# Patient Record
Sex: Female | Born: 1955 | Race: White | Hispanic: No | Marital: Married | Smoking: Never smoker
Health system: Southern US, Community
[De-identification: ages and names within clinical notes are randomized; demographics above are authoritative.]

## PROBLEM LIST (undated history)

## (undated) DIAGNOSIS — E8881 Metabolic syndrome: Secondary | ICD-10-CM

## (undated) DIAGNOSIS — H40053 Ocular hypertension, bilateral: Secondary | ICD-10-CM

## (undated) DIAGNOSIS — K449 Diaphragmatic hernia without obstruction or gangrene: Secondary | ICD-10-CM

## (undated) DIAGNOSIS — K219 Gastro-esophageal reflux disease without esophagitis: Secondary | ICD-10-CM

## (undated) HISTORY — PX: BREAST BIOPSY: SHX20

## (undated) HISTORY — DX: Ocular hypertension, bilateral: H40.053

## (undated) HISTORY — DX: Metabolic syndrome: E88.81

## (undated) HISTORY — DX: Diaphragmatic hernia without obstruction or gangrene: K44.9

## (undated) HISTORY — DX: Gastro-esophageal reflux disease without esophagitis: K21.9

---

## 2005-07-23 HISTORY — PX: CHOLECYSTECTOMY: SHX55

## 2007-06-11 ENCOUNTER — Encounter: Admission: RE | Admit: 2007-06-11 | Discharge: 2007-07-14 | Payer: Self-pay | Admitting: Family Medicine

## 2007-06-16 ENCOUNTER — Encounter: Admission: RE | Admit: 2007-06-16 | Discharge: 2007-07-14 | Payer: Self-pay | Admitting: Family Medicine

## 2007-09-02 ENCOUNTER — Other Ambulatory Visit: Admission: RE | Admit: 2007-09-02 | Discharge: 2007-09-02 | Payer: Self-pay | Admitting: Obstetrics and Gynecology

## 2009-02-14 ENCOUNTER — Inpatient Hospital Stay (HOSPITAL_COMMUNITY): Admission: EM | Admit: 2009-02-14 | Discharge: 2009-02-15 | Payer: Self-pay | Admitting: Emergency Medicine

## 2009-02-18 ENCOUNTER — Encounter: Admission: RE | Admit: 2009-02-18 | Discharge: 2009-02-18 | Payer: Self-pay | Admitting: Obstetrics and Gynecology

## 2009-03-30 ENCOUNTER — Encounter: Admission: RE | Admit: 2009-03-30 | Discharge: 2009-03-30 | Payer: Self-pay | Admitting: Cardiology

## 2009-10-28 ENCOUNTER — Encounter: Admission: RE | Admit: 2009-10-28 | Discharge: 2009-10-28 | Payer: Self-pay | Admitting: Family Medicine

## 2009-11-08 ENCOUNTER — Other Ambulatory Visit: Admission: RE | Admit: 2009-11-08 | Discharge: 2009-11-08 | Payer: Self-pay | Admitting: Obstetrics and Gynecology

## 2010-02-22 ENCOUNTER — Encounter: Admission: RE | Admit: 2010-02-22 | Discharge: 2010-02-22 | Payer: Self-pay | Admitting: Family Medicine

## 2010-10-29 LAB — CARDIAC PANEL(CRET KIN+CKTOT+MB+TROPI)
CK, MB: 0.9 ng/mL (ref 0.3–4.0)
CK, MB: 1 ng/mL (ref 0.3–4.0)
Relative Index: INVALID (ref 0.0–2.5)
Total CK: 42 U/L (ref 7–177)
Total CK: 49 U/L (ref 7–177)
Troponin I: 0.01 ng/mL (ref 0.00–0.06)

## 2010-10-29 LAB — POCT I-STAT, CHEM 8
BUN: 14 mg/dL (ref 6–23)
Calcium, Ion: 1.23 mmol/L (ref 1.12–1.32)
Chloride: 105 meq/L (ref 96–112)
Creatinine, Ser: 0.8 mg/dL (ref 0.4–1.2)
Glucose, Bld: 92 mg/dL (ref 70–99)
HCT: 40 % (ref 36.0–46.0)
Hemoglobin: 13.6 g/dL (ref 12.0–15.0)
Potassium: 3.8 meq/L (ref 3.5–5.1)
Sodium: 141 meq/L (ref 135–145)
TCO2: 26 mmol/L (ref 0–100)

## 2010-10-29 LAB — COMPREHENSIVE METABOLIC PANEL WITH GFR
ALT: 18 U/L (ref 0–35)
AST: 16 U/L (ref 0–37)
Albumin: 3.5 g/dL (ref 3.5–5.2)
Alkaline Phosphatase: 55 U/L (ref 39–117)
BUN: 11 mg/dL (ref 6–23)
CO2: 28 meq/L (ref 19–32)
Calcium: 9.2 mg/dL (ref 8.4–10.5)
Chloride: 107 meq/L (ref 96–112)
Creatinine, Ser: 0.67 mg/dL (ref 0.4–1.2)
GFR calc non Af Amer: >60 mL/min
Glucose, Bld: 98 mg/dL (ref 70–99)
Potassium: 3.7 meq/L (ref 3.5–5.1)
Sodium: 141 meq/L (ref 135–145)
Total Bilirubin: 0.8 mg/dL (ref 0.3–1.2)
Total Protein: 6.8 g/dL (ref 6.0–8.3)

## 2010-10-29 LAB — HEPARIN LEVEL (UNFRACTIONATED): Heparin Unfractionated: 0.85 IU/mL — ABNORMAL HIGH (ref 0.30–0.70)

## 2010-10-29 LAB — POCT CARDIAC MARKERS
CKMB, poc: <1 ng/mL — ABNORMAL LOW (ref 1.0–8.0)
Myoglobin, poc: 47.8 ng/mL (ref 12–200)
Troponin i, poc: <0.05 ng/mL (ref 0.00–0.09)

## 2010-10-29 LAB — DIFFERENTIAL
Lymphs Abs: 1.2 10*3/uL (ref 0.7–4.0)
Neutro Abs: 4.8 10*3/uL (ref 1.7–7.7)

## 2010-10-29 LAB — CK TOTAL AND CKMB (NOT AT ARMC)
CK, MB: 1 ng/mL (ref 0.3–4.0)
Relative Index: INVALID (ref 0.0–2.5)
Total CK: 48 U/L (ref 7–177)

## 2010-10-29 LAB — TSH: TSH: 1.493 u[IU]/mL (ref 0.350–4.500)

## 2010-10-29 LAB — CBC
MCHC: 34.4 g/dL (ref 30.0–36.0)
MCV: 89.5 fL (ref 78.0–100.0)
RBC: 4.33 MIL/uL (ref 3.87–5.11)
RDW: 12.9 % (ref 11.5–15.5)
WBC: 6.5 10*3/uL (ref 4.0–10.5)

## 2010-10-29 LAB — LIPID PANEL
Cholesterol: 175 mg/dL (ref 0–200)
LDL Cholesterol: 94 mg/dL (ref 0–99)

## 2010-10-29 LAB — PROTIME-INR
INR: 1 (ref 0.00–1.49)
Prothrombin Time: 12.8 s (ref 11.6–15.2)

## 2010-10-29 LAB — TROPONIN I: Troponin I: 0.01 ng/mL (ref 0.00–0.06)

## 2010-10-29 LAB — APTT: aPTT: 28 s (ref 24–37)

## 2010-12-05 NOTE — H&P (Signed)
Robyn Madden, Robyn Madden            ACCOUNT NO.:  [id number]   MEDICAL RECORD NO.:  [id number]          PATIENT TYPE:  INP   LOCATION:  1828                         FACILITY:  [facility]   PHYSICIAN:  Georga Hacking, M.D.DATE OF BIRTH:  [birth year]   DATE OF ADMISSION:  02/14/2009  DATE OF DISCHARGE:                              HISTORY & PHYSICAL   REASON FOR ADMISSION:  Chest pain.   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  She has a history of obesity and has  mild dyslipidemia with a previous reported HDL of 40 and an LDL of 120  or 130.  She has been in good health and has only a remote history of a  cholecystectomy.  At the time of her cholecystectomy, she  had some  vague left chest and jaw pain that resolved and required intravenous  nitroglycerin overnight, and a stress Cardiolite study in [place] several  years ago that was negative.  She has noticed some intermittent left  chest discomfort when she attempted to exercise on an exercise machine  but attributed it to musculoskeletal pain.  She was seeing patients in  the office this afternoon and had a patient that was quite ill and she  was under stress and in the process of getting the patient evaluated by  EMS, she had the onset of left-sided chest pain described as a deep  pressure-like pain and then had some aching in her left jaw area.  She  became concerned enough that she sought attention of a colleague in her  office.  An EKG at that time showed questionable vague ST depression  laterally.  She was given aspirin and nitroglycerin and transported here  by EMS.  While in transport here, she describes more that the chest pain  moved to the center of her chest but described that more as being  cramped on the stretcher and thought it might be more muscular.  Since  then, she thinks the pain may have eased off, although she has had some  very vague waxing and waning  jaw pains but has not really had any acute  distress.  She did not have severe diaphoresis or shortness of breath  with the pain but did note to being under stress at the time of the  pain.  She is largely pain-free right now.   PAST MEDICAL HISTORY:  Remarkable for:  1. Obesity.  2. Mild hyperlipidemia as noted above.  3. Possible reflux in the past.  4. There is no history of hypertension or diabetes.   PAST SURGERY:  Cholecystectomy.   ALLERGIES:  She reports ENVIRONMENTAL ALLERGIES TO PERFUME treated with  albuterol inhaler but has no medication allergies.   CURRENT MEDICATIONS:  None except for a p.r.n. albuterol inhaler.   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   FAMILY HISTORY:  Negative for premature cardiac disease.  Father is  alive at  age 66 with hypertension and a history of thyroid trouble.  Mother is alive, age 82, with a history of breast cancer.  One sister is  alive and well.  One brother has a history of testicular cancer.  Another brother is alive and well.   REVIEW OF SYSTEMS:  Her weight has been stable for several years.  She  has been overweight and obese.  No skin problems.  No eye, ear, nose, or  throat problems.  No asthma, cough, wheezing, or hemoptysis.  She  complains of occasional esophageal reflux and takes intermittent  Protonix and Tums.  She has no urinary symptoms or GYN symptoms.  She  has occasional plantar fasciitis and bursitis of her hip.  No history of  stroke or TIA or significant headaches.   PHYSICAL EXAMINATION:  GENERAL:  She is a very pleasant obese female  currently in no acute distress.  VITAL SIGNS:  Blood pressure is 113/72, pulse 90 and regular.  SKIN:  Warm and dry.  ENT:  Tinnie Gens, CNS clear.  Fundi not examined.  Pharynx is not  examined.  NECK:  Supple without masses, JVD, thyromegaly, or  bruits.  LUNGS:  Clear.  CARDIOVASCULAR:  Normal S1 and S2, no S3, and no murmur.  ABDOMEN:  Soft and nontender.  No masses are noted.  EXTREMITIES:  Distal pulses are 2 to 3+ and symmetrical.  There is no  edema noted.   A 12-lead EKG is normal.   IMPRESSION:  1. Atypical left chest pain with some jaw pain, currently normal      electrocardiogram (EKG), rule out myocardial infarction.  2. History of possible mild dyslipidemia.  3. Obesity.   RECOMMENDATIONS:  Begin IV heparin, nitro, low-dose beta blockers,  aspirin, and check serial enzymes.  Consider cardiac CT to evaluate  coronary anatomy if rules out for MI.      Georga Hacking, M.D.  Electronically Signed     Georga Hacking, M.D.  Electronically Signed    WST/MEDQ  D:  02/14/2009  T:  02/14/2009  Job:  [id number]

## 2010-12-08 NOTE — Discharge Summary (Signed)
Robyn Madden, BHATT            ACCOUNT NO.:  [id number]   MEDICAL RECORD NO.:  [id number]          PATIENT TYPE:  INP   LOCATION:  2925                         FACILITY:  [facility]   PHYSICIAN:  Georga Hacking, M.D.DATE OF BIRTH:  [birth year]   DATE OF ADMISSION:  02/14/2009  DATE OF DISCHARGE:  02/15/2009                               DISCHARGE SUMMARY   FINAL DIAGNOSES:  1. Chest pain of undetermined etiology- myocardial infarction ruled      out.  A cardiac computed tomography scan showing a calcium score of      0 and no significant proximal obstructive coronary artery disease.  2. Mild hyperlipidemia.  3. Obesity.  4. Possible history of esophageal reflux.   PROCEDURE:  Cardiac CT.   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  She has a remote history of cholecystectomy.  She had vague  left chest and jaw pain that resolved and required intravenous  nitroglycerin overnight.  A stress Cardiolite study in [place] several  years ago was negative.  She has noted intermittent left chest pain when  attempting to exercise on an exercise machine which attributed to  musculoskeletal pain.  She had the onset of left-sided chest pain  described as a deep pressure-like pain with aching in her left jaw area,  and sought the attention of a colleague in her office.  EKG at that time  showed vague ST depression laterally possibly.  She was given aspirin  and nitroglycerin, and transported here by EMS.  Pain on transport moved  more  to the center of her chest and the pain had eased off.  She was  admitted for further evaluation.  Please see the previously dictated  history and physical for remainder of the details.   HOSPITAL COURSE:  Lab data showed normal CBC.  Serial cardiac enzymes  were negative.  Cholesterol is 175, triglycerides 132, HDL 55, LDL of  94, total cholesterol ratio of 3.2.  TSH is 1.493.  Chest x-ray was  unremarkable.  A cardiac CT scan  showed a calcium score of 0.  There was  no evidence of proximal coronary disease present.  The ejection fraction  was 60%.  There was mild aortic root dilation at 38 mm.  There also was  felt to be a possible low-density structure involving the lateral left  hepatic lobe measuring 4.1 x 3.1 cm and a moderate size hiatal hernia.  It was felt it represented a cyst, but could be better evaluated by  abdominal ultrasound.  She was feeling fine.  Had no  recurrence of chest pain, and was discharged at home on Prilosec 40 mg  daily and to follow up with me in 2 weeks.  It is recommended that she  have an echocardiogram in 1 year.  She should also have a follow-up  abdominal ultrasound.      Georga Hacking, M.D.  Electronically Signed     Georga Hacking, M.D.  Electronically Signed    WST/MEDQ  D:  02/28/2009  T:  02/28/2009  Job:  [id number]   cc:  [facility]

## 2011-02-15 ENCOUNTER — Other Ambulatory Visit: Payer: Self-pay | Admitting: Family Medicine

## 2011-02-15 DIAGNOSIS — Z1231 Encounter for screening mammogram for malignant neoplasm of breast: Secondary | ICD-10-CM

## 2011-03-12 ENCOUNTER — Ambulatory Visit
Admission: RE | Admit: 2011-03-12 | Discharge: 2011-03-12 | Disposition: A | Discharge status: Home / Self Care | Payer: PRIVATE HEALTH INSURANCE | Source: Ambulatory Visit | Attending: Family Medicine | Admitting: Family Medicine

## 2011-03-12 DIAGNOSIS — Z1231 Encounter for screening mammogram for malignant neoplasm of breast: Secondary | ICD-10-CM

## 2011-04-10 ENCOUNTER — Emergency Department (HOSPITAL_COMMUNITY)
Admission: EM | Admit: 2011-04-10 | Discharge: 2011-04-10 | Disposition: A | Discharge status: Home / Self Care | Payer: PRIVATE HEALTH INSURANCE | Attending: Emergency Medicine | Admitting: Emergency Medicine

## 2011-04-10 ENCOUNTER — Emergency Department (HOSPITAL_COMMUNITY): Payer: PRIVATE HEALTH INSURANCE

## 2011-04-10 DIAGNOSIS — J069 Acute upper respiratory infection, unspecified: Secondary | ICD-10-CM | POA: Insufficient documentation

## 2011-04-10 DIAGNOSIS — R0602 Shortness of breath: Secondary | ICD-10-CM | POA: Insufficient documentation

## 2011-04-10 DIAGNOSIS — J40 Bronchitis, not specified as acute or chronic: Secondary | ICD-10-CM | POA: Insufficient documentation

## 2011-04-10 LAB — CBC
HCT: 40.8 % (ref 36.0–46.0)
Hemoglobin: 13.4 g/dL (ref 12.0–15.0)
MCHC: 32.8 g/dL (ref 30.0–36.0)
Platelets: 228 10*3/uL (ref 150–400)
RDW: 12.8 % (ref 11.5–15.5)
WBC: 5.7 10*3/uL (ref 4.0–10.5)

## 2011-04-10 LAB — BASIC METABOLIC PANEL
BUN: 12 mg/dL (ref 6–23)
Chloride: 104 mEq/L (ref 96–112)
GFR calc Af Amer: >60 mL/min (ref >60)
Potassium: 3.7 mEq/L (ref 3.5–5.1)
Sodium: 140 mEq/L (ref 135–145)

## 2011-04-10 LAB — DIFFERENTIAL
Lymphs Abs: 1 10*3/uL (ref 0.7–4.0)
Monocytes Absolute: 0.4 10*3/uL (ref 0.1–1.0)
Monocytes Relative: 6 % (ref 3–12)
Neutro Abs: 4.2 10*3/uL (ref 1.7–7.7)

## 2011-04-10 LAB — URINALYSIS, ROUTINE W REFLEX MICROSCOPIC
Bilirubin Urine: NEGATIVE
Glucose, UA: NEGATIVE mg/dL
Hgb urine dipstick: NEGATIVE
Protein, ur: NEGATIVE mg/dL
Specific Gravity, Urine: 1.004 — ABNORMAL LOW (ref 1.005–1.030)
Urobilinogen, UA: 0.2 mg/dL (ref 0.0–1.0)

## 2011-04-10 LAB — URINE MICROSCOPIC-ADD ON

## 2012-02-28 ENCOUNTER — Other Ambulatory Visit: Payer: Self-pay | Admitting: Family Medicine

## 2012-02-28 DIAGNOSIS — Z1231 Encounter for screening mammogram for malignant neoplasm of breast: Secondary | ICD-10-CM

## 2012-03-14 ENCOUNTER — Ambulatory Visit
Admission: RE | Admit: 2012-03-14 | Discharge: 2012-03-14 | Disposition: A | Discharge status: Home / Self Care | Payer: BC Managed Care – PPO | Source: Ambulatory Visit | Attending: Family Medicine | Admitting: Family Medicine

## 2012-03-14 DIAGNOSIS — Z1231 Encounter for screening mammogram for malignant neoplasm of breast: Secondary | ICD-10-CM

## 2013-03-10 ENCOUNTER — Other Ambulatory Visit: Payer: Self-pay

## 2013-03-10 DIAGNOSIS — Z1231 Encounter for screening mammogram for malignant neoplasm of breast: Secondary | ICD-10-CM

## 2013-03-18 ENCOUNTER — Ambulatory Visit: Payer: BC Managed Care – PPO

## 2013-04-13 ENCOUNTER — Ambulatory Visit
Admission: RE | Admit: 2013-04-13 | Discharge: 2013-04-13 | Disposition: A | Discharge status: Home / Self Care | Payer: BC Managed Care – PPO | Source: Ambulatory Visit

## 2013-04-13 DIAGNOSIS — Z1231 Encounter for screening mammogram for malignant neoplasm of breast: Secondary | ICD-10-CM

## 2013-09-21 ENCOUNTER — Encounter: Payer: Self-pay | Admitting: General Surgery

## 2014-04-16 ENCOUNTER — Other Ambulatory Visit: Payer: Self-pay

## 2014-04-16 DIAGNOSIS — Z1231 Encounter for screening mammogram for malignant neoplasm of breast: Secondary | ICD-10-CM

## 2014-04-29 ENCOUNTER — Ambulatory Visit: Payer: BC Managed Care – PPO

## 2014-05-07 ENCOUNTER — Ambulatory Visit
Admission: RE | Admit: 2014-05-07 | Discharge: 2014-05-07 | Disposition: A | Discharge status: Home / Self Care | Payer: BC Managed Care – PPO | Source: Ambulatory Visit

## 2014-05-07 DIAGNOSIS — Z1231 Encounter for screening mammogram for malignant neoplasm of breast: Secondary | ICD-10-CM

## 2015-04-20 ENCOUNTER — Other Ambulatory Visit: Payer: Self-pay

## 2015-04-20 DIAGNOSIS — Z1231 Encounter for screening mammogram for malignant neoplasm of breast: Secondary | ICD-10-CM

## 2015-05-11 ENCOUNTER — Ambulatory Visit: Payer: Self-pay

## 2015-06-03 ENCOUNTER — Ambulatory Visit
Admission: RE | Admit: 2015-06-03 | Discharge: 2015-06-03 | Disposition: A | Discharge status: Home / Self Care | Payer: BLUE CROSS/BLUE SHIELD | Source: Ambulatory Visit

## 2015-06-03 DIAGNOSIS — Z1231 Encounter for screening mammogram for malignant neoplasm of breast: Secondary | ICD-10-CM

## 2016-01-20 ENCOUNTER — Ambulatory Visit: Payer: Self-pay | Admitting: Podiatry

## 2016-06-06 ENCOUNTER — Other Ambulatory Visit: Payer: Self-pay | Admitting: Obstetrics and Gynecology

## 2016-06-06 DIAGNOSIS — Z1231 Encounter for screening mammogram for malignant neoplasm of breast: Secondary | ICD-10-CM

## 2016-07-18 ENCOUNTER — Ambulatory Visit
Admission: RE | Admit: 2016-07-18 | Discharge: 2016-07-18 | Disposition: A | Discharge status: Home / Self Care | Payer: No Typology Code available for payment source | Source: Ambulatory Visit | Attending: Obstetrics and Gynecology | Admitting: Obstetrics and Gynecology

## 2016-07-18 DIAGNOSIS — Z1231 Encounter for screening mammogram for malignant neoplasm of breast: Secondary | ICD-10-CM

## 2017-06-20 ENCOUNTER — Other Ambulatory Visit: Payer: Self-pay | Admitting: Obstetrics and Gynecology

## 2017-06-20 DIAGNOSIS — Z1231 Encounter for screening mammogram for malignant neoplasm of breast: Secondary | ICD-10-CM

## 2017-07-31 ENCOUNTER — Ambulatory Visit
Admission: RE | Admit: 2017-07-31 | Discharge: 2017-07-31 | Disposition: A | Discharge status: Home / Self Care | Payer: No Typology Code available for payment source | Source: Ambulatory Visit | Attending: Obstetrics and Gynecology | Admitting: Obstetrics and Gynecology

## 2017-07-31 DIAGNOSIS — Z1231 Encounter for screening mammogram for malignant neoplasm of breast: Secondary | ICD-10-CM

## 2017-09-11 ENCOUNTER — Encounter: Payer: Self-pay | Admitting: Family Medicine

## 2017-09-11 ENCOUNTER — Ambulatory Visit (INDEPENDENT_AMBULATORY_CARE_PROVIDER_SITE_OTHER): Payer: No Typology Code available for payment source | Admitting: Family Medicine

## 2017-09-11 VITALS — BP 140/74 | HR 105 | Temp 98.6°F | Wt 245.4 lb

## 2017-09-11 DIAGNOSIS — H40053 Ocular hypertension, bilateral: Secondary | ICD-10-CM | POA: Diagnosis not present

## 2017-09-11 DIAGNOSIS — Z1159 Encounter for screening for other viral diseases: Secondary | ICD-10-CM

## 2017-09-11 DIAGNOSIS — Z114 Encounter for screening for human immunodeficiency virus [HIV]: Secondary | ICD-10-CM

## 2017-09-11 DIAGNOSIS — Z0000 Encounter for general adult medical examination without abnormal findings: Secondary | ICD-10-CM

## 2017-09-11 DIAGNOSIS — E669 Obesity, unspecified: Secondary | ICD-10-CM | POA: Diagnosis not present

## 2017-09-11 DIAGNOSIS — E88819 Insulin resistance, unspecified: Secondary | ICD-10-CM

## 2017-09-11 DIAGNOSIS — E8881 Metabolic syndrome: Secondary | ICD-10-CM

## 2017-09-11 DIAGNOSIS — K219 Gastro-esophageal reflux disease without esophagitis: Secondary | ICD-10-CM

## 2017-09-11 DIAGNOSIS — K449 Diaphragmatic hernia without obstruction or gangrene: Secondary | ICD-10-CM | POA: Diagnosis not present

## 2017-09-11 HISTORY — DX: Gastro-esophageal reflux disease without esophagitis: K21.9

## 2017-09-11 HISTORY — DX: Metabolic syndrome: E88.81

## 2017-09-11 HISTORY — DX: Insulin resistance, unspecified: E88.819

## 2017-09-11 HISTORY — DX: Ocular hypertension, bilateral: H40.053

## 2017-09-11 LAB — COMPREHENSIVE METABOLIC PANEL
ALT: 17 U/L (ref 0–35)
AST: 14 U/L (ref 0–37)
Albumin: 3.8 g/dL (ref 3.5–5.2)
Alkaline Phosphatase: 59 U/L (ref 39–117)
BUN: 11 mg/dL (ref 6–23)
CO2: 26 mEq/L (ref 19–32)
Calcium: 9.2 mg/dL (ref 8.4–10.5)
Chloride: 107 mEq/L (ref 96–112)
Creatinine, Ser: 0.65 mg/dL (ref 0.40–1.20)
GFR: 98.44 mL/min (ref >60.00)
Glucose, Bld: 107 mg/dL — ABNORMAL HIGH (ref 70–99)
Potassium: 3.8 mEq/L (ref 3.5–5.1)
Sodium: 140 mEq/L (ref 135–145)
Total Bilirubin: 0.9 mg/dL (ref 0.2–1.2)
Total Protein: 6.8 g/dL (ref 6.0–8.3)

## 2017-09-11 LAB — CBC WITH DIFFERENTIAL/PLATELET
Basophils Absolute: 0.1 10*3/uL (ref 0.0–0.1)
Basophils Relative: 1.2 % (ref 0.0–3.0)
Eosinophils Absolute: 0.2 10*3/uL (ref 0.0–0.7)
Eosinophils Relative: 3.7 % (ref 0.0–5.0)
HCT: 41.5 % (ref 36.0–46.0)
Hemoglobin: 13.9 g/dL (ref 12.0–15.0)
Lymphocytes Relative: 24.8 % (ref 12.0–46.0)
Lymphs Abs: 1.1 10*3/uL (ref 0.7–4.0)
MCHC: 33.4 g/dL (ref 30.0–36.0)
MCV: 86.8 fl (ref 78.0–100.0)
Monocytes Absolute: 0.3 10*3/uL (ref 0.1–1.0)
Monocytes Relative: 6.8 % (ref 3.0–12.0)
Neutro Abs: 2.8 10*3/uL (ref 1.4–7.7)
Neutrophils Relative %: 63.5 % (ref 43.0–77.0)
Platelets: 232 10*3/uL (ref 150.0–400.0)
RBC: 4.78 Mil/uL (ref 3.87–5.11)
RDW: 13.7 % (ref 11.5–15.5)
WBC: 4.5 10*3/uL (ref 4.0–10.5)

## 2017-09-11 LAB — LIPID PANEL
Cholesterol: 176 mg/dL (ref 0–200)
HDL: 45 mg/dL (ref >39.00)
LDL Cholesterol: 108 mg/dL — ABNORMAL HIGH (ref 0–99)
NonHDL: 131.01
Total CHOL/HDL Ratio: 4
Triglycerides: 113 mg/dL (ref 0.0–149.0)
VLDL: 22.6 mg/dL (ref 0.0–40.0)

## 2017-09-11 LAB — POCT GLYCOSYLATED HEMOGLOBIN (HGB A1C): Hemoglobin A1C: 5.6

## 2017-09-11 NOTE — Progress Notes (Signed)
Subjective:    Joel Mericle is a 62 y.o. female and is here for a comprehensive physical exam.  Health Maintenance Due  Topic Date Due  . Hepatitis C Screening  Jul 06, 1956  . HIV Screening  07/17/1971  . PAP SMEAR  07/16/1977  . COLONOSCOPY  07/16/2006    PMHx, SurgHx, SocialHx, Medications, and Allergies were reviewed in the Visit Navigator and updated as appropriate.   Past Medical History:  Diagnosis Date  . Gastroesophageal reflux disease 09/11/2017  . Hernia, hiatal   . Insulin resistance 09/11/2017  . 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 CHOLECYSTECTOMY  2007   No family history on file. Social History   Tobacco Use  . Smoking status: Never Smoker  . Smokeless tobacco: Never Used  Substance Use Topics  . Alcohol use: Yes    Comment: rare  . Drug use: No   Review of Systems:   Pertinent items are noted in the HPI. Otherwise, ROS is negative.  Objective:   BP (!) 144/78   Pulse (!) 105   Temp 98.6 F (37 C) (Oral)   Wt 245 lb 6.4 oz (111.3 kg)   SpO2 96%    Wt Readings from Last 3 Encounters:  09/11/17 245 lb 6.4 oz (111.3 kg)     Ht Readings from Last 3 Encounters:  No data found for Ht   General appearance: alert, cooperative and appears stated age. Head: normocephalic, without obvious abnormality, atraumatic. Neck: no adenopathy, supple, symmetrical, trachea midline; thyroid not enlarged, symmetric, no tenderness/mass/nodules. Lungs: clear to auscultation bilaterally. Heart: regular rate and rhythm Abdomen: soft, non-tender; no masses,  no organomegaly. Extremities: extremities normal, atraumatic, no cyanosis or edema. Skin: skin color, texture, turgor normal, no rashes or lesions. Lymph: cervical, supraclavicular, and axillary nodes normal; no abnormal inguinal nodes palpated. Neurologic: grossly normal.  Assessment/Plan:   Pauleen was seen today for establish  care.  Diagnoses and all orders for this visit:  Screening for HIV (human immunodeficiency virus) -     HIV antibody  Need for hepatitis C screening test -     Hepatitis C antibody  Routine physical examination  Insulin resistance -     CBC with Differential/Platelet -     Comprehensive metabolic panel -     Lipid panel -     POCT glycosylated hemoglobin (Hb A1C)  Gastroesophageal reflux disease, esophagitis presence not specified -     CBC with Differential/Platelet -     Comprehensive metabolic panel  Ocular hypertension, bilateral -     CBC with Differential/Platelet -     Comprehensive metabolic panel  Hiatal hernia    Patient Counseling: [x]    Nutrition: Stressed importance of moderation in sodium/caffeine intake, saturated fat and cholesterol, caloric balance, sufficient intake of fresh fruits, vegetables, fiber, calcium, iron, and 1 mg of folate supplement per day (for females capable of pregnancy).  [x]    Stressed the importance of regular exercise.   [x]    Substance Abuse: Discussed cessation/primary prevention of tobacco, alcohol, or other drug use; driving or other dangerous activities under the influence; availability of treatment for abuse.   [x]    Injury prevention: Discussed safety belts, safety helmets, smoke detector, smoking near bedding or upholstery.   [x]    Sexuality: Discussed sexually transmitted diseases, partner selection, use of condoms, avoidance of unintended pregnancy  and contraceptive alternatives.  [x]    Dental health: Discussed importance of regular tooth brushing, flossing, and dental  visits.  [x]    Health maintenance and immunizations reviewed. Please refer to Health maintenance section.   Helane RimaErica Terrie Haring, DO [facility]

## 2017-09-11 NOTE — Patient Instructions (Addendum)
It was so nice to meet you today!   Your A1c is 5.6. GREAT JOB!

## 2017-09-12 LAB — HEPATITIS C ANTIBODY
Hepatitis C Ab: NONREACTIVE
SIGNAL TO CUT-OFF: 0.01 (ref <1.00)

## 2017-09-12 LAB — HIV ANTIBODY (ROUTINE TESTING W REFLEX): HIV 1&2 Ab, 4th Generation: NONREACTIVE

## 2017-10-10 ENCOUNTER — Other Ambulatory Visit: Payer: Self-pay | Admitting: Obstetrics and Gynecology

## 2017-10-10 DIAGNOSIS — Z803 Family history of malignant neoplasm of breast: Secondary | ICD-10-CM

## 2017-10-30 ENCOUNTER — Other Ambulatory Visit: Payer: No Typology Code available for payment source

## 2017-12-18 ENCOUNTER — Ambulatory Visit
Admission: RE | Admit: 2017-12-18 | Discharge: 2017-12-18 | Disposition: A | Discharge status: Home / Self Care | Payer: No Typology Code available for payment source | Source: Ambulatory Visit | Attending: Obstetrics and Gynecology | Admitting: Obstetrics and Gynecology

## 2017-12-18 DIAGNOSIS — Z803 Family history of malignant neoplasm of breast: Secondary | ICD-10-CM

## 2017-12-18 MED ORDER — GADOBENATE DIMEGLUMINE 529 MG/ML IV SOLN
20.0000 mL | Freq: Once | INTRAVENOUS | Status: AC | PRN
  Administered 2017-12-18: 20 mL via INTRAVENOUS

## 2017-12-23 ENCOUNTER — Other Ambulatory Visit: Payer: Self-pay | Admitting: Obstetrics and Gynecology

## 2017-12-23 DIAGNOSIS — R9389 Abnormal findings on diagnostic imaging of other specified body structures: Secondary | ICD-10-CM

## 2017-12-25 ENCOUNTER — Ambulatory Visit
Admission: RE | Admit: 2017-12-25 | Discharge: 2017-12-25 | Disposition: A | Discharge status: Home / Self Care | Payer: No Typology Code available for payment source | Source: Ambulatory Visit | Attending: Obstetrics and Gynecology | Admitting: Obstetrics and Gynecology

## 2017-12-25 DIAGNOSIS — R9389 Abnormal findings on diagnostic imaging of other specified body structures: Secondary | ICD-10-CM

## 2017-12-25 HISTORY — PX: BREAST BIOPSY: SHX20

## 2017-12-25 MED ORDER — GADOBENATE DIMEGLUMINE 529 MG/ML IV SOLN
20.0000 mL | Freq: Once | INTRAVENOUS | Status: AC | PRN
  Administered 2017-12-25: 20 mL via INTRAVENOUS

## 2017-12-26 ENCOUNTER — Other Ambulatory Visit: Payer: Self-pay | Admitting: Orthopedic Surgery

## 2018-01-26 ENCOUNTER — Encounter: Payer: Self-pay | Admitting: Family Medicine

## 2018-01-30 ENCOUNTER — Ambulatory Visit (HOSPITAL_BASED_OUTPATIENT_CLINIC_OR_DEPARTMENT_OTHER): Admit: 2018-01-30 | Payer: No Typology Code available for payment source | Admitting: Orthopedic Surgery

## 2018-01-30 ENCOUNTER — Encounter (HOSPITAL_BASED_OUTPATIENT_CLINIC_OR_DEPARTMENT_OTHER): Payer: Self-pay

## 2018-01-30 SURGERY — EXCISION MASS
Anesthesia: Choice | Laterality: Right

## 2018-04-16 ENCOUNTER — Ambulatory Visit (INDEPENDENT_AMBULATORY_CARE_PROVIDER_SITE_OTHER): Payer: No Typology Code available for payment source

## 2018-04-16 ENCOUNTER — Ambulatory Visit (INDEPENDENT_AMBULATORY_CARE_PROVIDER_SITE_OTHER): Payer: No Typology Code available for payment source | Admitting: Podiatry

## 2018-04-16 ENCOUNTER — Other Ambulatory Visit: Payer: Self-pay | Admitting: Podiatry

## 2018-04-16 ENCOUNTER — Encounter: Payer: Self-pay | Admitting: Podiatry

## 2018-04-16 VITALS — BP 126/79 | HR 92

## 2018-04-16 DIAGNOSIS — M205X2 Other deformities of toe(s) (acquired), left foot: Secondary | ICD-10-CM | POA: Diagnosis not present

## 2018-04-16 DIAGNOSIS — M79671 Pain in right foot: Secondary | ICD-10-CM

## 2018-04-16 DIAGNOSIS — M722 Plantar fascial fibromatosis: Secondary | ICD-10-CM

## 2018-04-16 DIAGNOSIS — M79672 Pain in left foot: Principal | ICD-10-CM

## 2018-04-16 NOTE — Progress Notes (Signed)
Subjective:   Patient ID: Robyn Madden, female   DOB: 62 y.o.   MRN: [id number]   HPI Patient presents ready to get new orthotics stating her repair is 62 years old and she is had chronic heel pain left which at times is acute and mostly is just chronic and low-grade.  Patient does not smoke and likes to be active   Review of Systems  All other systems reviewed and are negative.       Objective:  Physical Exam  Constitutional: She appears well-developed and well-nourished.  Cardiovascular: Intact distal pulses.  Pulmonary/Chest: Effort normal.  Musculoskeletal: Normal range of motion.  Neurological: She is alert.  Skin: Skin is warm.  Nursing note and vitals reviewed.   Neurovascular status found to be intact muscle strength is adequate range of motion within normal limits with patient noted to have moderate discomfort plantar heel left over right with patient also noted to have mild spurring of the first metatarsal head left but no loss of motion or crepitus within the joint.  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  We are going to make her a new orthotic in regard to make it with some lift in the heels to try to take pressure off the heel itself and also ambulate with kinetic wedge to try to reduce the stress on the big toe joint.  Patient may have her second pair rehabilitated and will see the ped orthotist to make that evaluation when the new pair comes in and he will dispense those to her  X-rays indicate that there is spurring of the first metatarsal head left over right with spurring of the plantar heel left over right and moderately high arch foot structure

## 2018-04-16 NOTE — Patient Instructions (Signed)

## 2018-04-23 ENCOUNTER — Encounter: Payer: Self-pay | Admitting: Family Medicine

## 2018-05-07 ENCOUNTER — Ambulatory Visit (INDEPENDENT_AMBULATORY_CARE_PROVIDER_SITE_OTHER): Payer: No Typology Code available for payment source | Admitting: Orthotics

## 2018-05-07 DIAGNOSIS — M205X2 Other deformities of toe(s) (acquired), left foot: Secondary | ICD-10-CM

## 2018-05-07 DIAGNOSIS — M722 Plantar fascial fibromatosis: Secondary | ICD-10-CM

## 2018-05-07 DIAGNOSIS — M79671 Pain in right foot: Secondary | ICD-10-CM

## 2018-05-07 DIAGNOSIS — M79672 Pain in left foot: Principal | ICD-10-CM

## 2018-05-07 NOTE — Progress Notes (Signed)
Patient came in today to pick up custom made foot orthotics.  The goals were accomplished and the patient reported no dissatisfaction with said orthotics.  Patient was advised of breakin period and how to report any issues. 

## 2018-05-08 ENCOUNTER — Telehealth: Payer: Self-pay | Admitting: Podiatry

## 2018-05-08 NOTE — Telephone Encounter (Signed)
Pt left message stating orthotics are very uncomfortable in the arch area and need to be adjusted but was told Raif[facility] Noble had no appt for 2 wks.  I left message for pt to call me and I will see when I can get her worked in.Marland KitchenMarland KitchenMarland Kitchen

## 2018-05-14 ENCOUNTER — Ambulatory Visit: Payer: No Typology Code available for payment source | Admitting: Orthotics

## 2018-05-14 DIAGNOSIS — M79671 Pain in right foot: Secondary | ICD-10-CM

## 2018-05-14 DIAGNOSIS — M722 Plantar fascial fibromatosis: Secondary | ICD-10-CM

## 2018-05-14 DIAGNOSIS — M79672 Pain in left foot: Principal | ICD-10-CM

## 2018-05-30 NOTE — Progress Notes (Signed)
Adjusted f/o to make better fit

## 2018-06-03 ENCOUNTER — Other Ambulatory Visit: Payer: No Typology Code available for payment source | Admitting: Orthotics

## 2018-06-12 ENCOUNTER — Ambulatory Visit (INDEPENDENT_AMBULATORY_CARE_PROVIDER_SITE_OTHER): Payer: No Typology Code available for payment source | Admitting: Orthotics

## 2018-06-12 DIAGNOSIS — M722 Plantar fascial fibromatosis: Secondary | ICD-10-CM

## 2018-06-12 NOTE — Progress Notes (Signed)
Patietn came in to p/u adjusted foot orthotics (lowered arch)...pleased.

## 2018-07-02 ENCOUNTER — Other Ambulatory Visit: Payer: Self-pay | Admitting: Obstetrics and Gynecology

## 2018-07-02 DIAGNOSIS — Z1231 Encounter for screening mammogram for malignant neoplasm of breast: Secondary | ICD-10-CM

## 2018-08-20 ENCOUNTER — Ambulatory Visit
Admission: RE | Admit: 2018-08-20 | Discharge: 2018-08-20 | Disposition: A | Discharge status: Home / Self Care | Payer: No Typology Code available for payment source | Source: Ambulatory Visit | Attending: Obstetrics and Gynecology | Admitting: Obstetrics and Gynecology

## 2018-08-20 DIAGNOSIS — Z1231 Encounter for screening mammogram for malignant neoplasm of breast: Secondary | ICD-10-CM

## 2018-10-08 ENCOUNTER — Telehealth: Payer: Self-pay | Admitting: Family Medicine

## 2018-10-08 ENCOUNTER — Other Ambulatory Visit: Payer: Self-pay

## 2018-10-08 MED ORDER — ALBUTEROL SULFATE HFA 108 (90 BASE) MCG/ACT IN AERS: 2.0000 | INHALATION_SPRAY | RESPIRATORY_TRACT | 3 refills | Status: AC | PRN

## 2018-10-08 NOTE — Telephone Encounter (Signed)
Called into pharmacy

## 2018-10-08 NOTE — Telephone Encounter (Signed)
Copied from CRM [id number]. Topic: Quick Communication - Rx Refill/Question >> Oct 08, 2018 11:26 AM Arlyss Gandy, NT wrote: Medication: albuterol (PROVENTIL HFA;VENTOLIN HFA) 108 (90 BASE) MCG/ACT inhaler   Has the patient contacted their pharmacy? Yes.   (Agent: If no, request that the patient contact the pharmacy for the refill.) (Agent: If yes, when and what did the pharmacy advise?)  Preferred Pharmacy (with phone number or street name): [id number] [place], [place] - [address] [phone] (Phone) [phone] (Fax)    Agent: Please be advised that RX refills may take up to 3 business days. We ask that you follow-up with your pharmacy.

## 2018-10-08 NOTE — Telephone Encounter (Signed)
Last app 09/11/17 ok to call in?

## 2018-10-08 NOTE — Telephone Encounter (Signed)
See note

## 2018-10-08 NOTE — Telephone Encounter (Signed)
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

## 2019-02-10 ENCOUNTER — Other Ambulatory Visit: Payer: Self-pay | Admitting: Obstetrics and Gynecology

## 2019-02-10 DIAGNOSIS — R9389 Abnormal findings on diagnostic imaging of other specified body structures: Secondary | ICD-10-CM

## 2019-02-10 DIAGNOSIS — Z803 Family history of malignant neoplasm of breast: Secondary | ICD-10-CM

## 2019-03-11 ENCOUNTER — Other Ambulatory Visit: Payer: No Typology Code available for payment source

## 2019-04-08 ENCOUNTER — Other Ambulatory Visit: Payer: Self-pay

## 2019-04-08 ENCOUNTER — Ambulatory Visit
Admission: RE | Admit: 2019-04-08 | Discharge: 2019-04-08 | Disposition: A | Discharge status: Home / Self Care | Payer: No Typology Code available for payment source | Source: Ambulatory Visit | Attending: Obstetrics and Gynecology | Admitting: Obstetrics and Gynecology

## 2019-04-08 DIAGNOSIS — Z803 Family history of malignant neoplasm of breast: Secondary | ICD-10-CM

## 2019-04-08 DIAGNOSIS — R9389 Abnormal findings on diagnostic imaging of other specified body structures: Secondary | ICD-10-CM

## 2019-04-08 MED ORDER — GADOBUTROL 1 MMOL/ML IV SOLN
9.0000 mL | Freq: Once | INTRAVENOUS | Status: AC | PRN
  Administered 2019-04-08: 9 mL via INTRAVENOUS

## 2019-08-04 ENCOUNTER — Other Ambulatory Visit: Payer: Self-pay | Admitting: Obstetrics and Gynecology

## 2019-08-04 DIAGNOSIS — Z1231 Encounter for screening mammogram for malignant neoplasm of breast: Secondary | ICD-10-CM

## 2019-09-09 ENCOUNTER — Ambulatory Visit
Admission: RE | Admit: 2019-09-09 | Discharge: 2019-09-09 | Disposition: A | Discharge status: Home / Self Care | Payer: No Typology Code available for payment source | Source: Ambulatory Visit | Attending: Obstetrics and Gynecology | Admitting: Obstetrics and Gynecology

## 2019-09-09 ENCOUNTER — Other Ambulatory Visit: Payer: Self-pay

## 2019-09-09 DIAGNOSIS — Z1231 Encounter for screening mammogram for malignant neoplasm of breast: Secondary | ICD-10-CM

## 2020-08-01 ENCOUNTER — Other Ambulatory Visit: Payer: Self-pay | Admitting: Obstetrics and Gynecology

## 2020-08-01 DIAGNOSIS — Z1231 Encounter for screening mammogram for malignant neoplasm of breast: Secondary | ICD-10-CM

## 2020-09-09 ENCOUNTER — Other Ambulatory Visit: Payer: Self-pay

## 2020-09-09 ENCOUNTER — Ambulatory Visit
Admission: RE | Admit: 2020-09-09 | Discharge: 2020-09-09 | Disposition: A | Discharge status: Home / Self Care | Payer: No Typology Code available for payment source | Source: Ambulatory Visit | Attending: Obstetrics and Gynecology | Admitting: Obstetrics and Gynecology

## 2020-09-09 DIAGNOSIS — Z1231 Encounter for screening mammogram for malignant neoplasm of breast: Secondary | ICD-10-CM

## 2021-07-27 IMAGING — MG DIGITAL SCREENING BILAT W/ TOMO W/ CAD
6 of 10 series · 6 of 30 positions shown · non-contrast
Comparison: Previous exam(s).

CLINICAL DATA: Screening.

EXAM:
DIGITAL SCREENING BILATERAL MAMMOGRAM WITH TOMO AND CAD

[L CC synth-2D (1 of 2)]
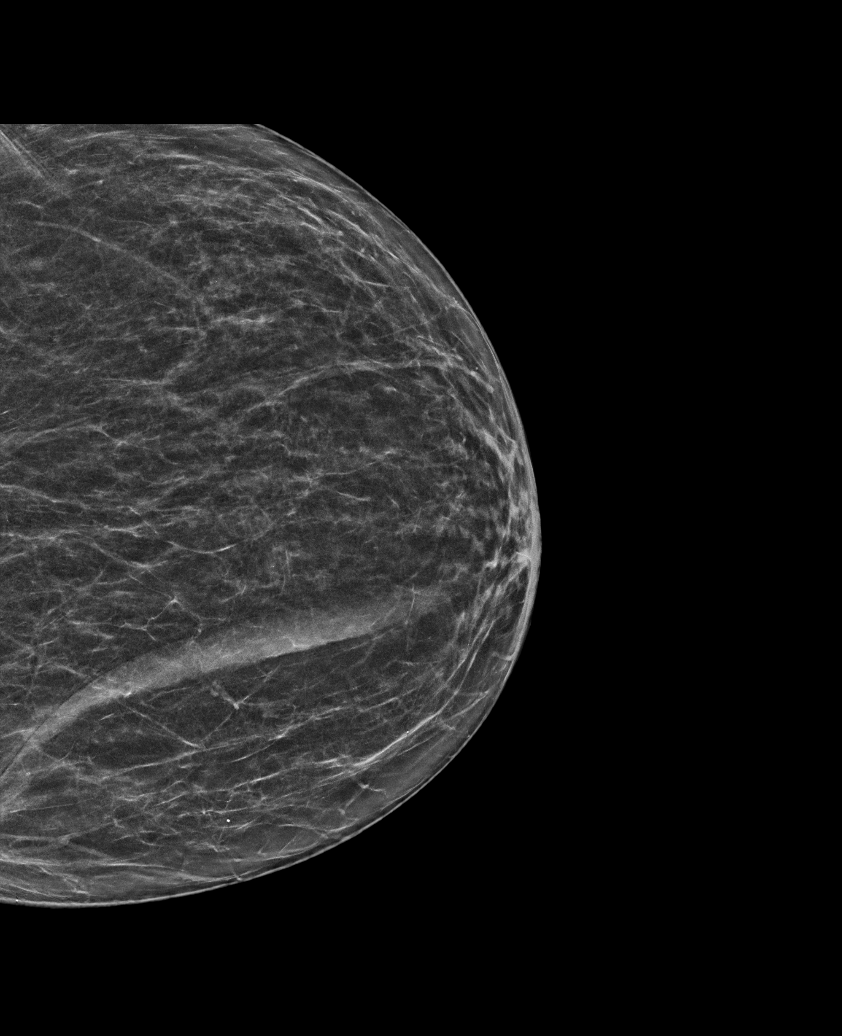

[R MLO synth-2D]
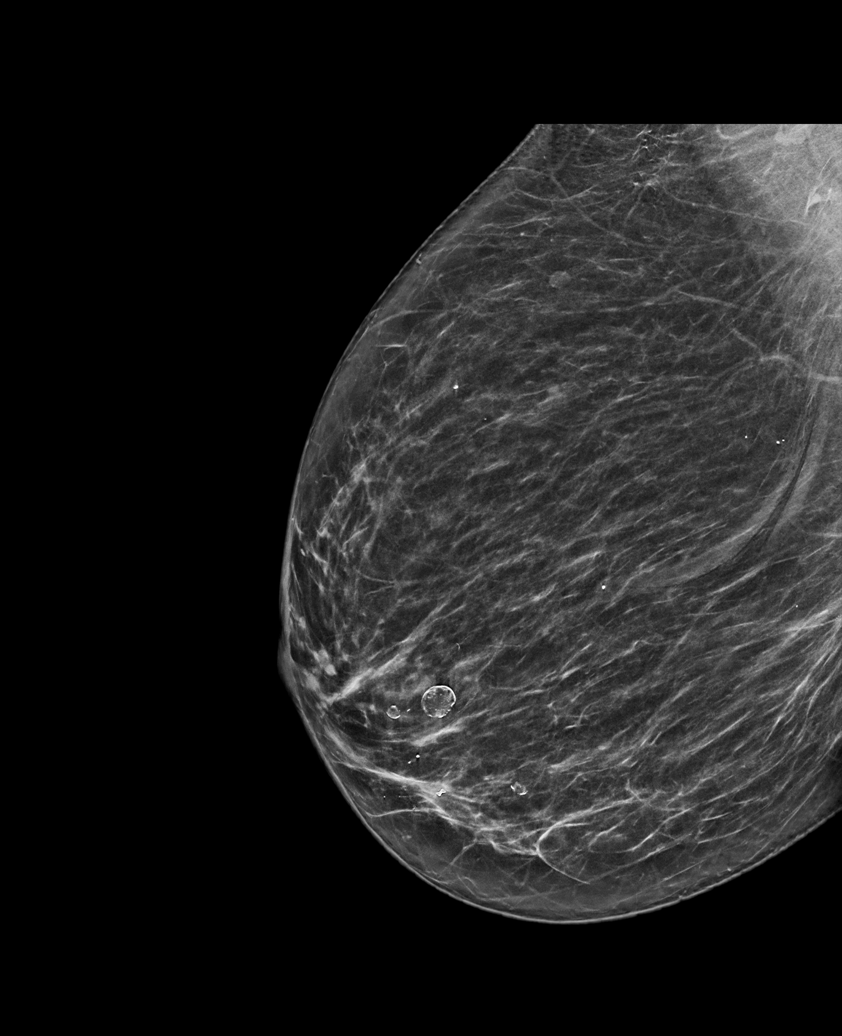

[L MLO synth-2D]
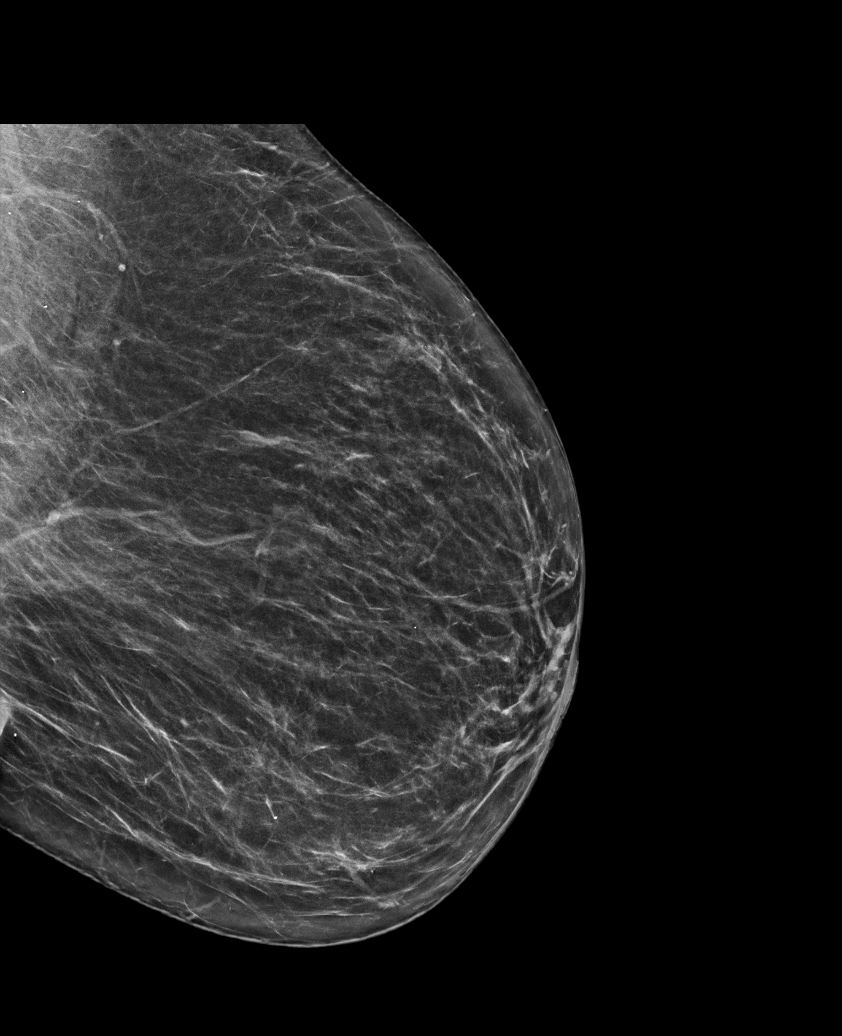

[R CC synth-2D]
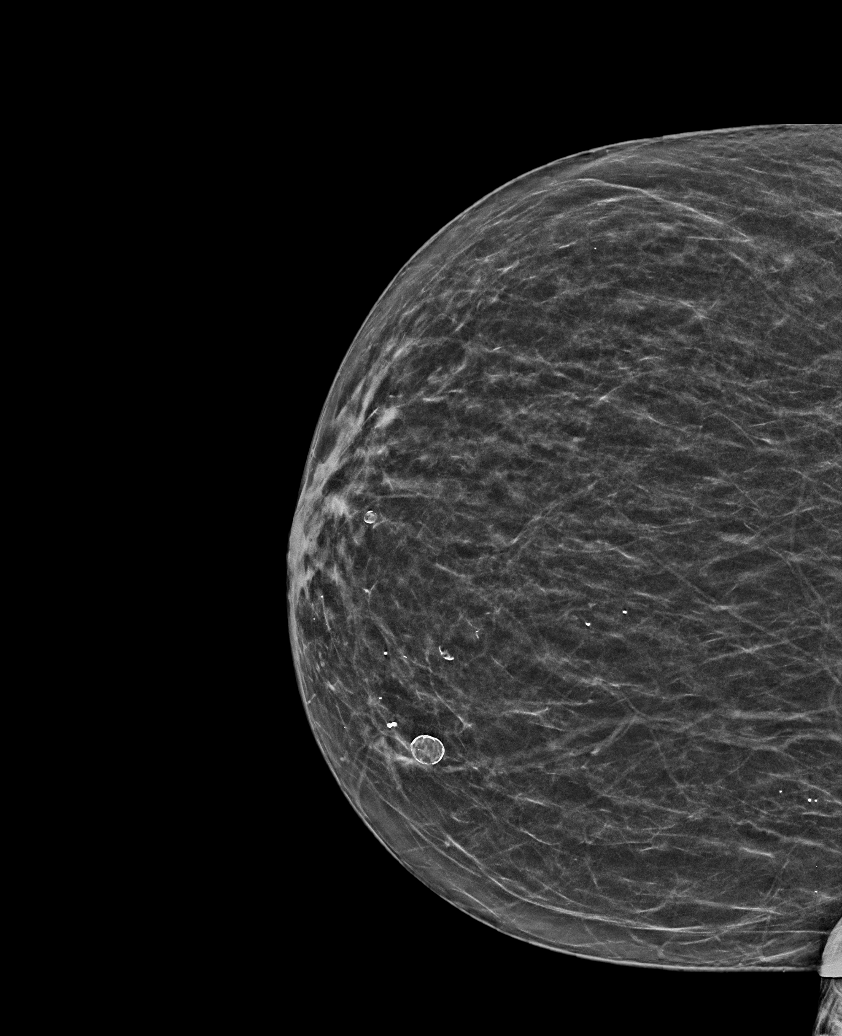

[L CC synth-2D (2 of 2)]
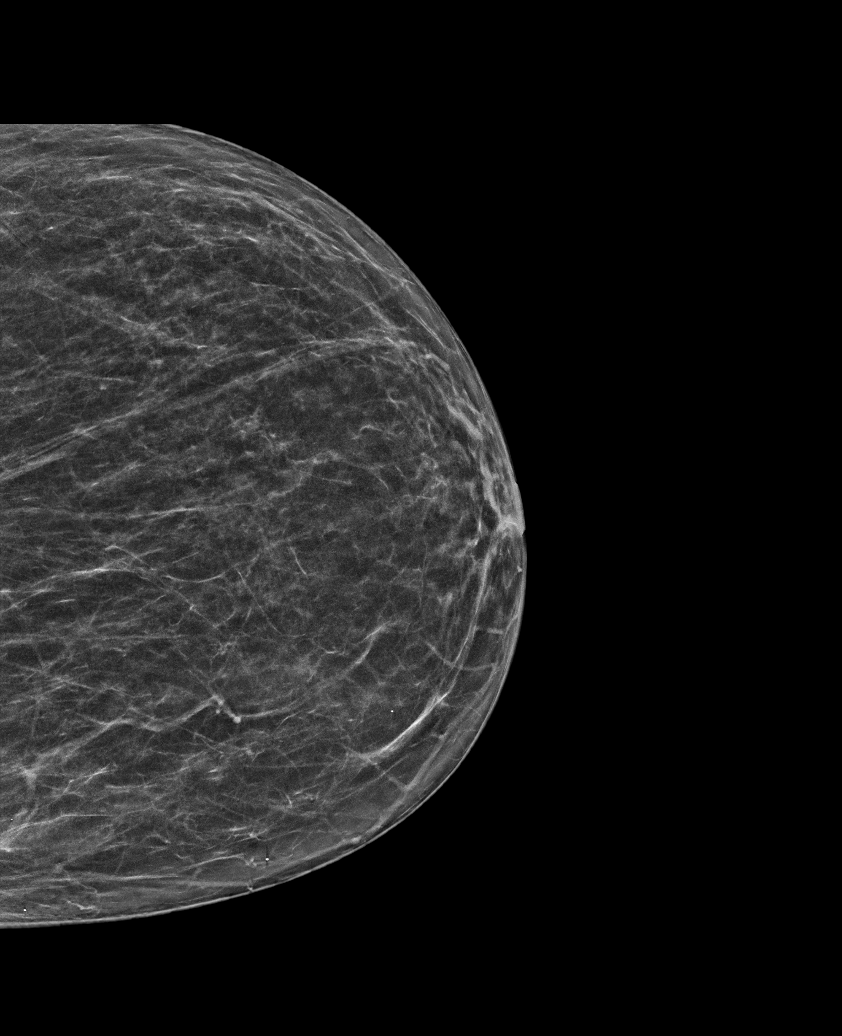

[R CC tomo · tomo slice 30/59.0]
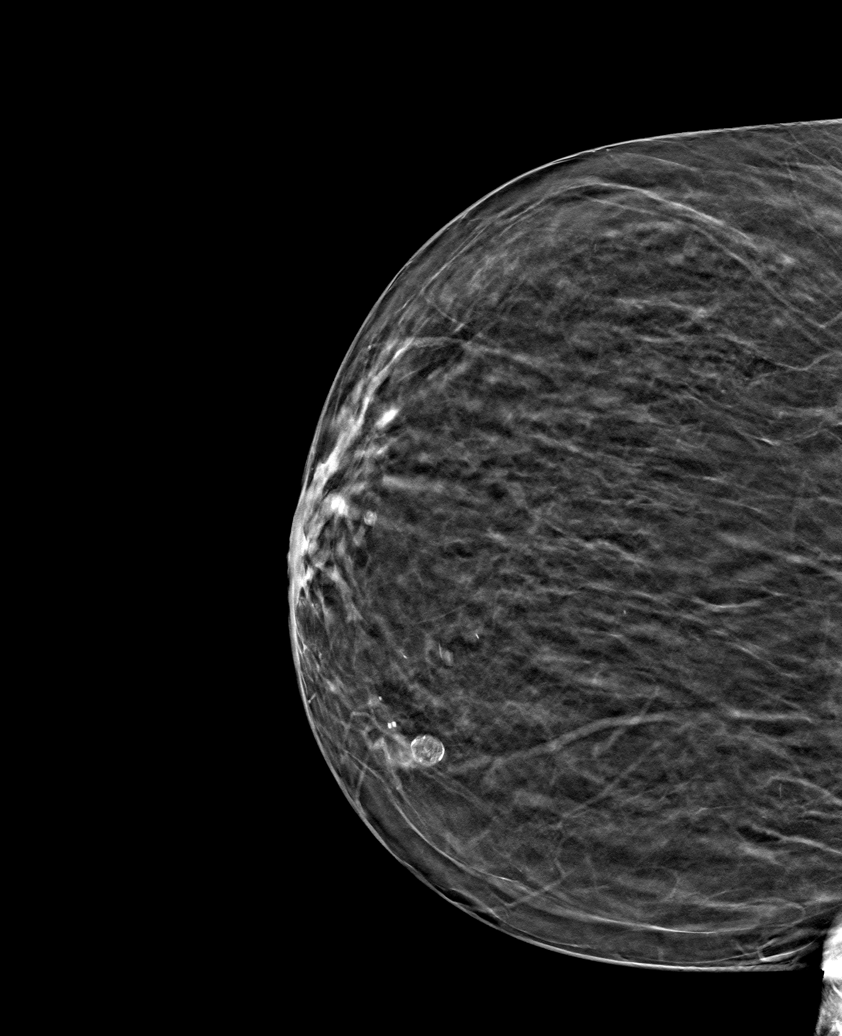

[6 of 30 positions shown; findings below may reference images not displayed]

ACR Breast Density Category b: There are scattered areas of
fibroglandular density.
FINDINGS: There are no findings suspicious for malignancy. Images were
processed with CAD.
IMPRESSION: No mammographic evidence of malignancy. A result letter of this
screening mammogram will be mailed directly to the patient.

RECOMMENDATION:
Screening mammogram in one year. (Code:CN-U-775)

BI-RADS CATEGORY  1: Negative.
